# Patient Record
Sex: Female | Born: 1960
Health system: Southern US, Community
[De-identification: ages and names within clinical notes are randomized; demographics above are authoritative.]

## PROBLEM LIST (undated history)

## (undated) DIAGNOSIS — D573 Sickle-cell trait: Secondary | ICD-10-CM

## (undated) DIAGNOSIS — E079 Disorder of thyroid, unspecified: Secondary | ICD-10-CM

## (undated) DIAGNOSIS — E789 Disorder of lipoprotein metabolism, unspecified: Secondary | ICD-10-CM

## (undated) DIAGNOSIS — I10 Essential (primary) hypertension: Secondary | ICD-10-CM

## (undated) DIAGNOSIS — E7800 Pure hypercholesterolemia, unspecified: Secondary | ICD-10-CM

## (undated) HISTORY — PX: THYROIDECTOMY: SHX17

---

## 2002-01-28 ENCOUNTER — Encounter: Payer: Self-pay | Admitting: Family Medicine

## 2002-01-28 ENCOUNTER — Ambulatory Visit (HOSPITAL_COMMUNITY): Admission: RE | Admit: 2002-01-28 | Discharge: 2002-01-28 | Payer: Self-pay | Admitting: Family Medicine

## 2012-10-06 ENCOUNTER — Other Ambulatory Visit (HOSPITAL_COMMUNITY): Payer: Self-pay | Admitting: General Practice

## 2012-10-06 DIAGNOSIS — Z139 Encounter for screening, unspecified: Secondary | ICD-10-CM

## 2012-10-08 ENCOUNTER — Ambulatory Visit (HOSPITAL_COMMUNITY): Payer: Self-pay

## 2012-10-25 ENCOUNTER — Telehealth: Payer: Self-pay

## 2012-10-25 NOTE — Telephone Encounter (Signed)
Pt was referred by Rush Barer, PA for screening colonoscopy. I called pt. She is not having any problems. She said she does not have any insurance and can't afford to have colonoscopy. I told her that I would send her some papers to fill out and return to [facility] at [facility] to see if she qualifies for assistant. Will send letter to PCP.

## 2015-09-27 ENCOUNTER — Encounter (HOSPITAL_COMMUNITY): Payer: Self-pay | Admitting: Emergency Medicine

## 2015-09-27 ENCOUNTER — Emergency Department (HOSPITAL_COMMUNITY)
Admission: EM | Admit: 2015-09-27 | Discharge: 2015-09-27 | Disposition: A | Discharge status: Home / Self Care | Payer: Self-pay | Attending: Emergency Medicine | Admitting: Emergency Medicine

## 2015-09-27 DIAGNOSIS — I10 Essential (primary) hypertension: Secondary | ICD-10-CM | POA: Insufficient documentation

## 2015-09-27 DIAGNOSIS — E876 Hypokalemia: Secondary | ICD-10-CM | POA: Insufficient documentation

## 2015-09-27 HISTORY — DX: Pure hypercholesterolemia, unspecified: E78.00

## 2015-09-27 HISTORY — DX: Disorder of lipoprotein metabolism, unspecified: E78.9

## 2015-09-27 HISTORY — DX: Essential (primary) hypertension: I10

## 2015-09-27 LAB — BASIC METABOLIC PANEL
Anion gap: 11 (ref 5–15)
BUN: 15 mg/dL (ref 6–20)
CO2: 24 mmol/L (ref 22–32)
Calcium: 11.3 mg/dL — ABNORMAL HIGH (ref 8.9–10.3)
Chloride: 103 mmol/L (ref 101–111)
Creatinine, Ser: 0.55 mg/dL (ref 0.44–1.00)
GFR calc Af Amer: >60 mL/min (ref >60)
Glucose, Bld: 107 mg/dL — ABNORMAL HIGH (ref 65–99)
Potassium: 3.3 mmol/L — ABNORMAL LOW (ref 3.5–5.1)
SODIUM: 138 mmol/L (ref 135–145)

## 2015-09-27 LAB — CBG MONITORING, ED: GLUCOSE-CAPILLARY: 87 mg/dL (ref 65–99)

## 2015-09-27 MED ORDER — POTASSIUM CHLORIDE CRYS ER 20 MEQ PO TBCR
40.0000 meq | EXTENDED_RELEASE_TABLET | Freq: Once | ORAL | Status: AC
  Administered 2015-09-27: 40 meq via ORAL
  Filled 2015-09-27: qty 2

## 2015-09-27 NOTE — ED Notes (Signed)
Per EMS: Pt came by EMS base after checking bp at [facility]. 185/104 en route.  175/94 last bp reading.  Pt denies cp, sob, h/a, dizziness.  Pt takes hctz and medicine for high cholesterol.  Pt took medication today.

## 2015-09-27 NOTE — ED Provider Notes (Signed)
CSN: [id number]     Arrival date & time 09/27/15  1646 History   First MD Initiated Contact with Patient 09/27/15 1937     Chief Complaint  Patient presents with  . Hypertension     (Consider location/radiation/quality/duration/timing/severity/associated sxs/prior Treatment) HPI Comments: Patient is a 55 year old female who presents to the emergency department by EMS after noting elevation in blood pressure.  The patient states that she went to a local drugstore to pick up a prescription, and checked her blood pressure. She noted to high readings. [place], and after having her blood pressure checked there her blood pressure was elevated at 185/104. The patient was brought to the hospital for evaluation and management of this issue. The patient denies any unusual headache, shortness of breath, chest pain. She states that time she has a little mild lightheadedness, but this is not new for her. The patient has a history of hypertension, and she takes hydrochlorothiazide for this problem. She denies missing any recent doses of medication. She's not had any recent changes in her diet. It is of note that she's had some upper respiratory symptoms, and has used some over-the-counter Delsym.  Patient is a 55 y.o. female presenting with hypertension. The history is provided by the patient.  Hypertension This is a chronic problem. Pertinent negatives include no abdominal pain, arthralgias, chest pain, coughing or neck pain.    Past Medical History  Diagnosis Date  . Hypertension   . Elevated serum cholesterol    History reviewed. No pertinent past surgical history. History reviewed. No pertinent family history. Social History  Substance Use Topics  . Smoking status: Never Smoker   . Smokeless tobacco: None  . Alcohol Use: No   OB History    No data available     Review of Systems  Constitutional: Negative for activity change.       All ROS Neg except as noted in HPI   HENT: Negative for nosebleeds.   Eyes: Negative for photophobia and discharge.  Respiratory: Negative for cough, shortness of breath and wheezing.   Cardiovascular: Negative for chest pain and palpitations.  Gastrointestinal: Negative for abdominal pain and blood in stool.  Genitourinary: Negative for dysuria, frequency and hematuria.  Musculoskeletal: Negative for back pain, arthralgias and neck pain.  Skin: Negative.   Neurological: Positive for light-headedness. Negative for dizziness, seizures and speech difficulty.  Psychiatric/Behavioral: Negative for hallucinations and confusion.      Allergies  Sulfa antibiotics  Home Medications   Prior to Admission medications   Not on File   BP 145/79 mmHg  Pulse 98  Temp(Src) 98.3 F (36.8 C) (Oral)  Resp 16  Ht  (1.651 m)  Wt 55.792 kg  BMI 20.47 kg/m2  SpO2 100% Physical Exam  Constitutional: She is oriented to person, place, and time. She appears well-developed and well-nourished.  Non-toxic appearance.  HENT:  Head: Normocephalic.  Right Ear: Tympanic membrane and external ear normal.  Left Ear: Tympanic membrane and external ear normal.  Eyes: EOM and lids are normal. Pupils are equal, round, and reactive to light. Right eye exhibits no chemosis and no hordeolum. Left eye exhibits no chemosis and no hordeolum. Right conjunctiva is injected. Right conjunctiva has no hemorrhage. Left conjunctiva is not injected. Left conjunctiva has no hemorrhage. No scleral icterus. Right eye exhibits normal extraocular motion. Left eye exhibits normal extraocular motion.  Fundoscopic exam:      The right eye shows no AV nicking,  no exudate, no hemorrhage and no papilledema.       The left eye shows no AV nicking, no exudate, no hemorrhage and no papilledema.  Neck: Normal range of motion. Neck supple. Carotid bruit is not present.  Cardiovascular: Normal rate, regular rhythm, normal heart sounds, intact distal pulses and normal  pulses.   Pulmonary/Chest: Breath sounds normal. No respiratory distress.  Abdominal: Soft. Bowel sounds are normal. There is no tenderness. There is no guarding.  Musculoskeletal: Normal range of motion.  Lymphadenopathy:       Head (right side): No submandibular adenopathy present.       Head (left side): No submandibular adenopathy present.    She has no cervical adenopathy.  Neurological: She is alert and oriented to person, place, and time. She has normal strength. She displays no tremor. No cranial nerve deficit or sensory deficit. She exhibits normal muscle tone. She displays no seizure activity. Gait normal.  Skin: Skin is warm and dry.  Psychiatric: She has a normal mood and affect. Her speech is normal.  Nursing note and vitals reviewed.   ED Course  Procedures (including critical care time) Labs Review Labs Reviewed  BASIC METABOLIC PANEL    Imaging Review No results found. I have personally reviewed and evaluated these images and lab results as part of my medical decision-making.   EKG Interpretation None      MDM  Initial blood pressure was ranging from 175/94, to 166/68.  Capillary blood glucose is 97.   The patient denies any chest pain, headache, nausea vomiting, or shortness of breath on recheck.  Electrocardiogram shows a normal sinus rhythm at 87 bpm. There is a slightly rightward axis present. There are nonspecific ST-T wave changes, but no evidence of any acute event at this time.   The basic metabolic panel shows the potassium to be slightly low at 3.3, otherwise essentially within normal limits.  The patient is ambulatory without problem. The blood pressure is now down to 145/79. There no gross neurologic deficits appreciated. There is no evidence of any in organ damage.  I have advised the patient to follow with her primary physician to see if any adjustments will be needed with her current medications. The patient is invited to return to the  emergency department immediately if any changes, problems, or concerns.    Final diagnoses:  None    **I have reviewed nursing notes, vital signs, and all appropriate lab and imaging results for this patient.Ivery Quale*    Garnell Phenix, PA-C 09/27/15 2054  Bethann BerkshireJoseph Zammit, MD 09/28/15 [phone]

## 2015-09-27 NOTE — Discharge Instructions (Signed)
Your blood pressure has improved significantly since your admission to the emergency department. Your potassium was slightly low. Please increase foods high in potassium. Please discuss your pressure elevation with your primary physician next week. Please return to the emergency department if any chest pain, shortness of breath, excessive nausea vomiting, unusual headache, or changes in your general condition. Hypertension Hypertension is another name for high blood pressure. High blood pressure forces your heart to work harder to pump blood. A blood pressure reading has two numbers, which includes a higher number over a lower number (example: 110/72). HOME CARE   Have your blood pressure rechecked by your doctor.  Only take medicine as told by your doctor. Follow the directions carefully. The medicine does not work as well if you skip doses. Skipping doses also puts you at risk for problems.  Do not smoke.  Monitor your blood pressure at home as told by your doctor. GET HELP IF:  You think you are having a reaction to the medicine you are taking.  You have repeat headaches or feel dizzy.  You have puffiness (swelling) in your ankles.  You have trouble with your vision. GET HELP RIGHT AWAY IF:   You get a very bad headache and are confused.  You feel weak, numb, or faint.  You get chest or belly (abdominal) pain.  You throw up (vomit).  You cannot breathe very well. MAKE SURE YOU:   Understand these instructions.  Will watch your condition.  Will get help right away if you are not doing well or get worse.   This information is not intended to replace advice given to you by your health care provider. Make sure you discuss any questions you have with your health care provider.   Document Released: 12/10/2007 Document Revised: 06/28/2013 Document Reviewed: 04/15/2013 Elsevier Interactive Patient Education [employer]2016 Elsevier Inc.

## 2015-09-27 NOTE — ED Notes (Signed)
cbg 97, no meds en route. Pt took otc meds for cold that she had last week.  Pt had unifocal pvc on 12 lead per ems.

## 2016-02-13 ENCOUNTER — Encounter (HOSPITAL_COMMUNITY): Payer: Self-pay | Admitting: *Deleted

## 2016-02-13 ENCOUNTER — Emergency Department (HOSPITAL_COMMUNITY)
Admission: EM | Admit: 2016-02-13 | Discharge: 2016-02-13 | Disposition: A | Discharge status: Home / Self Care | Payer: Self-pay | Attending: Emergency Medicine | Admitting: Emergency Medicine

## 2016-02-13 DIAGNOSIS — T7840XA Allergy, unspecified, initial encounter: Secondary | ICD-10-CM | POA: Insufficient documentation

## 2016-02-13 DIAGNOSIS — I10 Essential (primary) hypertension: Secondary | ICD-10-CM | POA: Insufficient documentation

## 2016-02-13 DIAGNOSIS — Z79899 Other long term (current) drug therapy: Secondary | ICD-10-CM | POA: Insufficient documentation

## 2016-02-13 MED ORDER — METHYLPREDNISOLONE SODIUM SUCC 125 MG IJ SOLR
125.0000 mg | Freq: Once | INTRAMUSCULAR | Status: AC
  Administered 2016-02-13: 125 mg via INTRAVENOUS
  Filled 2016-02-13: qty 2

## 2016-02-13 MED ORDER — DIPHENHYDRAMINE HCL 50 MG/ML IJ SOLN
25.0000 mg | Freq: Once | INTRAMUSCULAR | Status: AC
  Administered 2016-02-13: 25 mg via INTRAVENOUS
  Filled 2016-02-13: qty 1

## 2016-02-13 MED ORDER — HYDROCORTISONE 1 % EX CREA
TOPICAL_CREAM | Freq: Once | CUTANEOUS | Status: AC
  Administered 2016-02-13: 1 via TOPICAL
  Filled 2016-02-13: qty 1.5

## 2016-02-13 MED ORDER — METOPROLOL SUCCINATE ER 25 MG PO TB24: 25.0000 mg | ORAL_TABLET | Freq: Every day | ORAL | 0 refills | Status: DC

## 2016-02-13 MED ORDER — FAMOTIDINE IN NACL 20-0.9 MG/50ML-% IV SOLN
20.0000 mg | Freq: Once | INTRAVENOUS | Status: AC
  Administered 2016-02-13: 20 mg via INTRAVENOUS
  Filled 2016-02-13: qty 50

## 2016-02-13 NOTE — ED Provider Notes (Signed)
[facility] Provider Note   CSN: [id number] Arrival date & time: 02/13/16  1851  First Provider Contact:  None       History   Chief Complaint Chief Complaint  Patient presents with  . Allergic Reaction    HPI Amber House is a 55 y.o. female.  Pt is here today with a possible allergic reaction to lisinopril.  She was put on lisinopril on 8/3 and said she's been lightheaded since then.  She also noticed some blisters on the right side of her neck that started today.  The blisters burn, but don't "hurt."  Pt denies any sob or tongue or lip swelling.        Past Medical History:  Diagnosis Date  . Elevated serum cholesterol   . Hypertension     There are no active problems to display for this patient.   History reviewed. No pertinent surgical history.  OB History    No data available       Home Medications    Prior to Admission medications   Medication Sig Start Date End Date Taking? Authorizing Provider  lovastatin (MEVACOR) 20 MG tablet Take 20 mg by mouth daily at 6 PM.   Yes Historical Provider, MD  triamterene-hydrochlorothiazide (MAXZIDE-25) 37.5-25 MG tablet Take 1 tablet by mouth daily.   Yes Historical Provider, MD  metoprolol succinate (TOPROL-XL) 25 MG 24 hr tablet Take 1 tablet (25 mg total) by mouth daily. 02/13/16   Jacalyn LefevreJulie Shakena Callari, MD    Family History No family history on file.  Social History Social History  Substance Use Topics  . Smoking status: Never Smoker  . Smokeless tobacco: Never Used  . Alcohol use No     Allergies   Sulfa antibiotics   Review of Systems Review of Systems  Skin: Positive for rash.  All other systems reviewed and are negative.    Physical Exam Updated Vital Signs BP 152/80 (BP Location: Right Arm)   Pulse 107   Temp 98.2 F (36.8 C) (Oral)   Resp 19   Ht 5' 5.5" (1.664 m)   Wt 125 lb (56.7 kg)   SpO2 100%   BMI 20.48 kg/m   Physical Exam  Constitutional: She is oriented to person,  place, and time. She appears well-developed and well-nourished.  HENT:  Head: Normocephalic and atraumatic.  Right Ear: External ear normal.  Left Ear: External ear normal.  Ears:  Nose: Nose normal.  Mouth/Throat: Oropharynx is clear and moist.  Eyes: Conjunctivae and EOM are normal. Pupils are equal, round, and reactive to light.  Neck: Normal range of motion. Neck supple.    Cardiovascular: Normal rate, regular rhythm, normal heart sounds and intact distal pulses.   Pulmonary/Chest: Effort normal and breath sounds normal.  Abdominal: Soft. Bowel sounds are normal.  Musculoskeletal: Normal range of motion.  Neurological: She is alert and oriented to person, place, and time.  Skin: Skin is warm and dry.  Psychiatric: She has a normal mood and affect. Her behavior is normal. Judgment and thought content normal.  Nursing note and vitals reviewed.    ED Treatments / Results  Labs (all labs ordered are listed, but only abnormal results are displayed) Labs Reviewed - No data to display  EKG  EKG Interpretation None       Radiology No results found.  Procedures Procedures (including critical care time)  Medications Ordered in ED Medications  hydrocortisone cream 1 % (not administered)  diphenhydrAMINE (BENADRYL) injection 25 mg (25  mg Intravenous Given 02/13/16 2015)  methylPREDNISolone sodium succinate (SOLU-MEDROL) 125 mg/2 mL injection 125 mg (125 mg Intravenous Given 02/13/16 2015)  famotidine (PEPCID) IVPB 20 mg premix (0 mg Intravenous Stopped 02/13/16 2101)     Initial Impression / Assessment and Plan / ED Course  I have reviewed the triage vital signs and the nursing notes.  Pertinent labs & imaging results that were available during my care of the patient were reviewed by me and considered in my medical decision making (see chart for details).  Clinical Course   I don't think it is shingles as it is on both sides of body (right neck and left ear).  This is a  very atypical presentation for allergy; however, since it started after pt started on lisinopril, I am going to have her hold that medication.  Since she is hypertensive, I will start lopressor instead and instruct pt to f/u with her pcp.  Final Clinical Impressions(s) / ED Diagnoses   Final diagnoses:  Essential hypertension  Allergic reaction, initial encounter    New Prescriptions New Prescriptions   METOPROLOL SUCCINATE (TOPROL-XL) 25 MG 24 HR TABLET    Take 1 tablet (25 mg total) by mouth daily.     Jacalyn Lefevre, MD 02/13/16 2103

## 2016-02-13 NOTE — ED Triage Notes (Signed)
Pt states that she was placed on lisinopril 5 mg 02/07/2016, advises that she has been "light headed" since being placed on the medication, also has blisters noted to right neck area,

## 2018-03-12 DIAGNOSIS — E890 Postprocedural hypothyroidism: Secondary | ICD-10-CM | POA: Diagnosis not present

## 2018-03-12 DIAGNOSIS — I10 Essential (primary) hypertension: Secondary | ICD-10-CM | POA: Diagnosis not present

## 2018-03-12 DIAGNOSIS — G629 Polyneuropathy, unspecified: Secondary | ICD-10-CM | POA: Diagnosis not present

## 2018-04-21 DIAGNOSIS — M25532 Pain in left wrist: Secondary | ICD-10-CM | POA: Diagnosis not present

## 2018-05-18 ENCOUNTER — Ambulatory Visit
Admission: EM | Admit: 2018-05-18 | Discharge: 2018-05-18 | Disposition: A | Discharge status: Home / Self Care | Payer: 59

## 2018-05-18 ENCOUNTER — Other Ambulatory Visit: Payer: Self-pay

## 2018-05-18 DIAGNOSIS — R040 Epistaxis: Secondary | ICD-10-CM

## 2018-05-18 MED ORDER — OXYMETAZOLINE HCL 0.05 % NA SOLN: NASAL | 0 refills | Status: DC

## 2018-05-18 NOTE — ED Triage Notes (Addendum)
Pt states nose bleed 3x yesterday and has been having recurring nosebleeds since 2009. Has HTN. Sees ENT for same. No bleeding today. Has appt with PCP on Dec 4th

## 2018-05-18 NOTE — Discharge Instructions (Addendum)
Recommend following up with an ear nose and throat specialist for your recurring nosebleeds

## 2018-06-02 DIAGNOSIS — R040 Epistaxis: Secondary | ICD-10-CM | POA: Diagnosis not present

## 2018-06-02 DIAGNOSIS — J310 Chronic rhinitis: Secondary | ICD-10-CM | POA: Diagnosis not present

## 2018-06-02 DIAGNOSIS — I10 Essential (primary) hypertension: Secondary | ICD-10-CM | POA: Diagnosis not present

## 2019-09-28 ENCOUNTER — Ambulatory Visit
Admission: EM | Admit: 2019-09-28 | Discharge: 2019-09-28 | Disposition: A | Discharge status: Home / Self Care | Payer: 59 | Attending: Family Medicine | Admitting: Family Medicine

## 2019-09-28 ENCOUNTER — Encounter: Payer: Self-pay | Admitting: Emergency Medicine

## 2019-09-28 ENCOUNTER — Other Ambulatory Visit: Payer: Self-pay

## 2019-09-28 DIAGNOSIS — M545 Low back pain, unspecified: Secondary | ICD-10-CM

## 2019-09-28 HISTORY — DX: Disorder of thyroid, unspecified: E07.9

## 2019-09-28 HISTORY — DX: Sickle-cell trait: D57.3

## 2019-09-28 LAB — URINALYSIS, COMPLETE (UACMP) WITH MICROSCOPIC
Bacteria, UA: NONE SEEN
Bilirubin Urine: NEGATIVE
Glucose, UA: NEGATIVE mg/dL
Ketones, ur: NEGATIVE mg/dL
Leukocytes,Ua: NEGATIVE
Nitrite: NEGATIVE
Protein, ur: NEGATIVE mg/dL
Specific Gravity, Urine: 1.025 (ref 1.005–1.030)
Squamous Epithelial / LPF: NONE SEEN (ref 0–5)
WBC, UA: NONE SEEN WBC/hpf (ref 0–5)
pH: 5.5 (ref 5.0–8.0)

## 2019-09-28 MED ORDER — MELOXICAM 15 MG PO TABS: 15.0000 mg | ORAL_TABLET | Freq: Every day | ORAL | 0 refills | Status: AC | PRN

## 2019-09-28 NOTE — ED Provider Notes (Signed)
[facility]    CSN: [id number] Arrival date & time: 09/28/19  1029   History   Chief Complaint Chief Complaint  Patient presents with  . Back Pain   HPI  59 year old female presents with low back pain.  Patient reports ongoing low back pain over the past 10 days.  Comes and goes.  Has been worse for the past 2 days.  She reports right-sided low back pain.  No recent fall, trauma, injury.  She has taken Tylenol and use a heating pad without resolution.  Seems to be worse with certain activities/movements.  No radicular symptoms.  Patient reports that she feels like she is urinating a little more frequently.  She is concerned that she may have a UTI.  No reports of saddle anesthesia or incontinence.  No other complaints at this time.  Past Medical History:  Diagnosis Date  . Elevated serum cholesterol   . Hypertension   . Sickle cell trait ([place])   . Thyroid disease    Past Surgical History:  Procedure Laterality Date  . THYROIDECTOMY      OB History   No obstetric history on file.    Home Medications    Prior to Admission medications   Medication Sig Start Date End Date Taking? Authorizing Provider  cholecalciferol (VITAMIN D3) 25 MCG (1000 UNIT) tablet Take 1,000 Units by mouth daily.   Yes [provider]  levothyroxine (SYNTHROID) 75 MCG tablet Take by mouth. 02/12/18 09/28/19 Yes [provider]  losartan (COZAAR) 50 MG tablet Take 50 mg by mouth daily. 09/14/19  Yes [provider]  lovastatin (MEVACOR) 20 MG tablet Take 20 mg by mouth daily at 6 PM.   Yes [provider]  metoprolol tartrate (LOPRESSOR) 25 MG tablet Take 0.5 tablets by mouth 2 (two) times daily. 10/20/18 10/20/19 Yes [provider]  meloxicam (MOBIC) 15 MG tablet Take 1 tablet (15 mg total) by mouth daily as needed for pain. 09/28/19   Coral Spikes, DO  hydrochlorothiazide (HYDRODIURIL) 25 MG tablet TAKE ONE-HALF TABLET (12.5 MG TOTAL) BY MOUTH  DAILY. 08/20/17 09/28/19  [provider]  metoprolol succinate (TOPROL-XL) 25 MG 24 hr tablet Take 1 tablet (25 mg total) by mouth daily. 02/13/16 09/28/19  Isla Pence, MD  potassium chloride (K-DUR) 10 MEQ tablet  04/28/18 09/28/19  [provider]    Family History Family History  Problem Relation Age of Onset  . Pancreatic cancer Mother   . Hypertension Mother   . Hyperlipidemia Mother   . Other Father        blood disorder and brain tumor  . Dementia Father     Social History Social History   Tobacco Use  . Smoking status: Former Smoker    Quit date: 2005    Years since quitting: 16.2  . Smokeless tobacco: Never Used  Substance Use Topics  . Alcohol use: No  . Drug use: No     Allergies   Lisinopril and Sulfa antibiotics   Review of Systems Review of Systems  Genitourinary: Positive for frequency.  Musculoskeletal: Positive for back pain.   Physical Exam Triage Vital Signs ED Triage Vitals  Enc Vitals Group     BP 09/28/19 1107 (!) 150/77     Pulse Rate 09/28/19 1107 62     Resp 09/28/19 1107 18     Temp 09/28/19 1107 98 F (36.7 C)     Temp Source 09/28/19 1107 Oral     SpO2  09/28/19 1107 100 %     Weight 09/28/19 1107 130 lb (59 kg)     Height 09/28/19 1107 5\' 5"  (1.651 m)     Head Circumference --      Peak Flow --      Pain Score 09/28/19 1106 7     Pain Loc --      Pain Edu? --      Excl. in GC? --    Updated Vital Signs BP (!) 150/77 (BP Location: Left Arm)   Pulse 62   Temp 98 F (36.7 C) (Oral)   Resp 18   Ht 5\' 5"  (1.651 m)   Wt 59 kg   SpO2 100%   BMI 21.63 kg/m   Visual Acuity Right Eye Distance:   Left Eye Distance:   Bilateral Distance:    Right Eye Near:   Left Eye Near:    Bilateral Near:     Physical Exam   UC Treatments / Results  Labs (all labs ordered are listed, but only abnormal results are displayed) Labs Reviewed  URINALYSIS, COMPLETE (UACMP) WITH MICROSCOPIC - Abnormal; Notable for the  following components:      Result Value   Hgb urine dipstick TRACE (*)    All other components within normal limits    EKG   Radiology No results found.  Procedures Procedures (including critical care time)  Medications Ordered in UC Medications - No data to display  Initial Impression / Assessment and Plan / UC Course  I have reviewed the triage vital signs and the nursing notes.  Pertinent labs & imaging results that were available during my care of the patient were reviewed by me and considered in my medical decision making (see chart for details).    59 year old female presents with low back pain.  Urine was negative today.  This appears to be musculoskeletal.  Meloxicam as directed.  Work note given.  Final Clinical Impressions(s) / UC Diagnoses   Final diagnoses:  Acute right-sided low back pain, unspecified whether sciatica present     Discharge Instructions     Rest.  Ice and Heat.  Medication as directed.  Take care  Dr.    ED Prescriptions    Medication Sig Dispense Auth. Provider   meloxicam (MOBIC) 15 MG tablet Take 1 tablet (15 mg total) by mouth daily as needed for pain. 30 tablet 46, DO     PDMP not reviewed this encounter.   Adriana Simas, Tommie Sams 09/28/19 1207

## 2019-09-28 NOTE — ED Triage Notes (Signed)
Patient in today c/o right sided lower back pain that radiates down her right leg off & on x 10 days. Patient states symptoms have been worse and more constant for the last 2 days. No injury noted. Patient does think that she is has been urinating a little more often. Patient has taken OTC Tylenol a few times and has been using a heating pad.

## 2019-09-28 NOTE — Discharge Instructions (Signed)
Rest.  Ice and Heat.  Medication as directed.  Take care  Dr. Adriana Simas

## 2021-06-29 ENCOUNTER — Ambulatory Visit
Admission: EM | Admit: 2021-06-29 | Discharge: 2021-06-29 | Disposition: A | Discharge status: Home / Self Care | Payer: 59 | Attending: Physician Assistant | Admitting: Physician Assistant

## 2021-06-29 ENCOUNTER — Other Ambulatory Visit: Payer: Self-pay

## 2021-06-29 ENCOUNTER — Encounter: Payer: Self-pay | Admitting: Emergency Medicine

## 2021-06-29 DIAGNOSIS — R051 Acute cough: Secondary | ICD-10-CM

## 2021-06-29 DIAGNOSIS — B349 Viral infection, unspecified: Secondary | ICD-10-CM | POA: Diagnosis not present

## 2021-06-29 DIAGNOSIS — R490 Dysphonia: Secondary | ICD-10-CM | POA: Diagnosis not present

## 2021-06-29 DIAGNOSIS — Z20822 Contact with and (suspected) exposure to covid-19: Secondary | ICD-10-CM | POA: Insufficient documentation

## 2021-06-29 DIAGNOSIS — R110 Nausea: Secondary | ICD-10-CM | POA: Diagnosis not present

## 2021-06-29 LAB — RAPID INFLUENZA A&B ANTIGENS
Influenza A (ARMC): NEGATIVE
Influenza B (ARMC): NEGATIVE

## 2021-06-29 MED ORDER — PROMETHAZINE-DM 6.25-15 MG/5ML PO SYRP: 5.0000 mL | ORAL_SOLUTION | Freq: Four times a day (QID) | ORAL | 0 refills | Status: DC | PRN

## 2021-06-29 NOTE — ED Provider Notes (Signed)
[facility]    CSN: [id number] Arrival date & time: 06/29/21  1041      History   Chief Complaint Chief Complaint  Patient presents with   Cough    HPI Amber House is a 60 y.o. female presenting for 4-day history of cough, congestion, scratchy throat and voice hoarseness.  No associated fever, sinus pain, chest pain or breathing difficulty.  Cough is dry.  She says it feels like it needs to be productive though.  No vomiting or diarrhea.  Has been a bit nauseous.  Exposed to influenza at a presumed party.  Has been taking over-the-counter decongestants.  No other complaints.  HPI  Past Medical History:  Diagnosis Date   Elevated serum cholesterol    Hypertension    Sickle cell trait (HCC)    Thyroid disease     There are no problems to display for this patient.   Past Surgical History:  Procedure Laterality Date   THYROIDECTOMY      OB History   No obstetric history on file.      Home Medications    Prior to Admission medications   Medication Sig Start Date End Date Taking? Authorizing Provider  cholecalciferol (VITAMIN D3) 25 MCG (1000 UNIT) tablet Take 1,000 Units by mouth daily.   Yes [provider]  hydrochlorothiazide (HYDRODIURIL) 12.5 MG tablet Take 12.5 mg by mouth daily. 05/28/21  Yes [provider]  levothyroxine (SYNTHROID) 75 MCG tablet Take by mouth. 02/12/18 06/29/21 Yes [provider]  losartan (COZAAR) 50 MG tablet Take 50 mg by mouth daily. 09/14/19  Yes [provider]  lovastatin (MEVACOR) 20 MG tablet Take 20 mg by mouth daily at 6 PM.   Yes [provider]  metoprolol tartrate (LOPRESSOR) 25 MG tablet Take 0.5 tablets by mouth 2 (two) times daily. 10/20/18 06/29/21 Yes [provider]  promethazine-dextromethorphan (PROMETHAZINE-DM) 6.25-15 MG/5ML syrup Take 5 mLs by mouth 4 (four) times daily as needed. 06/29/21  Yes Shirlee Latch, PA-C  meloxicam (MOBIC) 15 MG tablet Take  1 tablet (15 mg total) by mouth daily as needed for pain. 09/28/19   Tommie Sams, DO  metoprolol succinate (TOPROL-XL) 25 MG 24 hr tablet Take 1 tablet (25 mg total) by mouth daily. 02/13/16 09/28/19  Jacalyn Lefevre, MD  potassium chloride (K-DUR) 10 MEQ tablet  04/28/18 09/28/19  [provider]    Family History Family History  Problem Relation Age of Onset   Pancreatic cancer Mother    Hypertension Mother    Hyperlipidemia Mother    Other Father        blood disorder and brain tumor   Dementia Father     Social History Social History   Tobacco Use   Smoking status: Former    Types: Cigarettes    Quit date: 2005    Years since quitting: 17.9   Smokeless tobacco: Never  Vaping Use   Vaping Use: Never used  Substance Use Topics   Alcohol use: No   Drug use: No     Allergies   Lisinopril and Sulfa antibiotics   Review of Systems Review of Systems  Constitutional:  Positive for fatigue. Negative for chills, diaphoresis and fever.  HENT:  Positive for congestion, rhinorrhea, sore throat and voice change. Negative for ear pain, sinus pressure and sinus pain.   Respiratory:  Positive for cough. Negative for shortness of breath.   Gastrointestinal:  Negative for abdominal pain, nausea and vomiting.  Musculoskeletal:  Negative for arthralgias and myalgias.  Skin:  Negative for rash.  Neurological:  Negative for weakness and headaches.  Hematological:  Negative for adenopathy.    Physical Exam Triage Vital Signs ED Triage Vitals  Enc Vitals Group     BP 06/29/21 1146 121/75     Pulse Rate 06/29/21 1146 75     Resp 06/29/21 1146 14     Temp 06/29/21 1146 98.9 F (37.2 C)     Temp Source 06/29/21 1146 Oral     SpO2 06/29/21 1146 98 %     Weight 06/29/21 1142 135 lb (61.2 kg)     Height 06/29/21 1142 5\' 5"  (1.651 m)     Head Circumference --      Peak Flow --      Pain Score 06/29/21 1142 0     Pain Loc --      Pain Edu? --      Excl. in [place]? --    No  data found.  Updated Vital Signs BP 121/75 (BP Location: Left Arm)    Pulse 75    Temp 98.9 F (37.2 C) (Oral)    Resp 14    Ht 5\' 5"  (1.651 m)    Wt 135 lb (61.2 kg)    SpO2 98%    BMI 22.47 kg/m      Physical Exam Vitals and nursing note reviewed.  Constitutional:      General: She is not in acute distress.    Appearance: Normal appearance. She is ill-appearing. She is not toxic-appearing.     Comments: +voice hoarseness  HENT:     Head: Normocephalic and atraumatic.     Nose: Congestion present.     Mouth/Throat:     Mouth: Mucous membranes are moist.     Pharynx: Oropharynx is clear. Posterior oropharyngeal erythema present.  Eyes:     General: No scleral icterus.       Right eye: No discharge.        Left eye: No discharge.     Conjunctiva/sclera: Conjunctivae normal.  Cardiovascular:     Rate and Rhythm: Normal rate and regular rhythm.     Heart sounds: Normal heart sounds.  Pulmonary:     Effort: Pulmonary effort is normal. No respiratory distress.     Breath sounds: Normal breath sounds.  Musculoskeletal:     Cervical back: Neck supple.  Skin:    General: Skin is dry.  Neurological:     General: No focal deficit present.     Mental Status: She is alert. Mental status is at baseline.     Motor: No weakness.     Gait: Gait normal.  Psychiatric:        Mood and Affect: Mood normal.        Behavior: Behavior normal.        Thought Content: Thought content normal.     UC Treatments / Results  Labs (all labs ordered are listed, but only abnormal results are displayed) Labs Reviewed  RAPID INFLUENZA A&B ANTIGENS  SARS CORONAVIRUS 2 (TAT 6-24 HRS)    EKG   Radiology No results found.  Procedures Procedures (including critical care time)  Medications Ordered in UC Medications - No data to display  Initial Impression / Assessment and Plan / UC Course  I have reviewed the triage vital signs and the nursing notes.  Pertinent labs & imaging results  that were available during my care of the patient were reviewed  by me and considered in my medical decision making (see chart for details).  60 year old female presenting for 4-day history of cough, congestion, scratchy throat and voice hoarseness.  No associated fever. Positive exposure to influenza.  Vitals all stable.  She is mildly ill-appearing but nontoxic.  Nasal congestion on exam as well as mild posterior pharyngeal erythema and voice hoarseness.  Chest clear auscultation heart regular rate and rhythm.  Influenza rapid testing negative.  Discussed result with patient.  PCR COVID test obtained.  Current CDC guidelines, isolation protocol and ED precautions reviewed.  Suspect viral illness.  Advised increasing rest and fluids.  Sent Promethazine DM to pharmacy.  If voice hoarseness not better in a couple of weeks, advised to follow-up with PCP.  Patient is non-smoker.   Final Clinical Impressions(s) / UC Diagnoses   Final diagnoses:  Viral illness  Acute cough  Voice hoarseness  Nausea without vomiting     Discharge Instructions      URI/COLD SYMPTOMS: Your exam today is consistent with a viral illness. Antibiotics are not indicated at this time. Use medications as directed, including cough syrup, nasal saline, and decongestants. Your symptoms should improve over the next few days and resolve within 7-10 days. Increase rest and fluids. F/u if symptoms worsen or predominate such as sore throat, ear pain, productive cough, shortness of breath, or if you develop high fevers or worsening fatigue over the next several days.    You have received COVID testing today either for positive exposure, concerning symptoms that could be related to COVID infection, screening purposes, or re-testing after confirmed positive.  Your test obtained today checks for active viral infection in the last 1-2 weeks. If your test is negative now, you can still test positive later. So, if you do develop  symptoms you should either get re-tested and/or isolate x 5 days and then strict mask use x 5 days (unvaccinated) or mask use x 10 days (vaccinated). Please follow CDC guidelines.  While Rapid antigen tests come back in 15-20 minutes, send out PCR/molecular test results typically come back within 1-3 days. In the mean time, if you are symptomatic, assume this could be a positive test and treat/monitor yourself as if you do have COVID.   We will call with test results if positive. Please download the MyChart app and set up a profile to access test results.   If symptomatic, go home and rest. Push fluids. Take Tylenol as needed for discomfort. Gargle warm salt water. Throat lozenges. Take Mucinex DM or Robitussin for cough. Humidifier in bedroom to ease coughing. Warm showers. Also review the COVID handout for more information.  COVID-19 INFECTION: The incubation period of COVID-19 is approximately 14 days after exposure, with most symptoms developing in roughly 4-5 days. Symptoms may range in severity from mild to critically severe. Roughly 80% of those infected will have mild symptoms. People of any age may become infected with COVID-19 and have the ability to transmit the virus. The most common symptoms include: fever, fatigue, cough, body aches, headaches, sore throat, nasal congestion, shortness of breath, nausea, vomiting, diarrhea, changes in smell and/or taste.    COURSE OF ILLNESS Some patients may begin with mild disease which can progress quickly into critical symptoms. If your symptoms are worsening please call ahead to the Emergency Department and proceed there for further treatment. Recovery time appears to be roughly 1-2 weeks for mild symptoms and 3-6 weeks for severe disease.   GO IMMEDIATELY TO ER FOR FEVER  YOU ARE UNABLE TO GET DOWN WITH TYLENOL, BREATHING PROBLEMS, CHEST PAIN, FATIGUE, LETHARGY, INABILITY TO EAT OR DRINK, ETC  QUARANTINE AND ISOLATION: To help decrease the spread of  COVID-19 please remain isolated if you have COVID infection or are highly suspected to have COVID infection. This means -stay home and isolate to one room in the home if you live with others. Do not share a bed or bathroom with others while ill, sanitize and wipe down all countertops and keep common areas clean and disinfected. Stay home for 5 days. If you have no symptoms or your symptoms are resolving after 5 days, you can leave your house. Continue to wear a mask around others for 5 additional days. If you have been in close contact (within 6 feet) of someone diagnosed with COVID 19, you are advised to quarantine in your home for 14 days as symptoms can develop anywhere from 2-14 days after exposure to the virus. If you develop symptoms, you  must isolate.  Most current guidelines for COVID after exposure -unvaccinated: isolate 5 days and strict mask use x 5 days. Test on day 5 is possible -vaccinated: wear mask x 10 days if symptoms do not develop -You do not necessarily need to be tested for COVID if you have + exposure and  develop symptoms. Just isolate at home x10 days from symptom onset During this global pandemic, CDC advises to practice social distancing, try to stay at least 75ft away from others at all times. Wear a face covering. Wash and sanitize your hands regularly and avoid going anywhere that is not necessary.  KEEP IN MIND THAT THE COVID TEST IS NOT 100% ACCURATE AND YOU SHOULD STILL DO EVERYTHING TO PREVENT POTENTIAL SPREAD OF VIRUS TO OTHERS (WEAR MASK, WEAR GLOVES, WASH HANDS AND SANITIZE REGULARLY). IF INITIAL TEST IS NEGATIVE, THIS MAY NOT MEAN YOU ARE DEFINITELY NEGATIVE. MOST ACCURATE TESTING IS DONE 5-7 DAYS AFTER EXPOSURE.   It is not advised by CDC to get re-tested after receiving a positive COVID test since you can still test positive for weeks to months after you have already cleared the virus.   *If you have not been vaccinated for COVID, I strongly suggest you consider  getting vaccinated as long as there are no contraindications.       ED Prescriptions     Medication Sig Dispense Auth. Provider   promethazine-dextromethorphan (PROMETHAZINE-DM) 6.25-15 MG/5ML syrup Take 5 mLs by mouth 4 (four) times daily as needed. 118 mL Shirlee Latch, PA-C      PDMP not reviewed this encounter.   Shirlee Latch, PA-C 06/29/21 1302

## 2021-06-29 NOTE — ED Triage Notes (Signed)
Patient c/o cough and chest congestion that started on Tuesday.  Patient denies fevers.

## 2021-06-29 NOTE — Discharge Instructions (Addendum)

## 2021-06-30 ENCOUNTER — Emergency Department
Admission: EM | Admit: 2021-06-30 | Discharge: 2021-06-30 | Disposition: A | Discharge status: Home / Self Care | Payer: 59 | Attending: Emergency Medicine | Admitting: Emergency Medicine

## 2021-06-30 ENCOUNTER — Other Ambulatory Visit: Payer: Self-pay

## 2021-06-30 ENCOUNTER — Emergency Department: Payer: 59

## 2021-06-30 ENCOUNTER — Encounter: Payer: Self-pay | Admitting: Emergency Medicine

## 2021-06-30 DIAGNOSIS — J181 Lobar pneumonia, unspecified organism: Secondary | ICD-10-CM | POA: Diagnosis not present

## 2021-06-30 DIAGNOSIS — Z87891 Personal history of nicotine dependence: Secondary | ICD-10-CM | POA: Diagnosis not present

## 2021-06-30 DIAGNOSIS — Z20822 Contact with and (suspected) exposure to covid-19: Secondary | ICD-10-CM | POA: Diagnosis not present

## 2021-06-30 DIAGNOSIS — J101 Influenza due to other identified influenza virus with other respiratory manifestations: Secondary | ICD-10-CM | POA: Diagnosis not present

## 2021-06-30 DIAGNOSIS — Z79899 Other long term (current) drug therapy: Secondary | ICD-10-CM | POA: Diagnosis not present

## 2021-06-30 DIAGNOSIS — I10 Essential (primary) hypertension: Secondary | ICD-10-CM | POA: Insufficient documentation

## 2021-06-30 DIAGNOSIS — J189 Pneumonia, unspecified organism: Secondary | ICD-10-CM

## 2021-06-30 DIAGNOSIS — R059 Cough, unspecified: Secondary | ICD-10-CM | POA: Diagnosis present

## 2021-06-30 LAB — RESP PANEL BY RT-PCR (FLU A&B, COVID) ARPGX2
Influenza A by PCR: POSITIVE — AB
Influenza B by PCR: NEGATIVE
SARS Coronavirus 2 by RT PCR: NEGATIVE

## 2021-06-30 LAB — SARS CORONAVIRUS 2 (TAT 6-24 HRS): SARS Coronavirus 2: NEGATIVE

## 2021-06-30 MED ORDER — AZITHROMYCIN 500 MG PO TABS
500.0000 mg | ORAL_TABLET | Freq: Every day | ORAL | Status: DC
  Administered 2021-06-30: 16:00:00 500 mg via ORAL
  Filled 2021-06-30: qty 1

## 2021-06-30 MED ORDER — AZITHROMYCIN 500 MG PO TABS: 500.0000 mg | ORAL_TABLET | Freq: Every day | ORAL | 0 refills | Status: AC

## 2021-06-30 NOTE — ED Provider Notes (Signed)
[facility] Emergency Department Provider Note    ____________________________________________   Event Date/Time   First MD Initiated Contact with Patient 06/30/21 1539     (approximate)  I have reviewed the triage vital signs and the nursing notes.   HISTORY  Chief Complaint Cough    HPI Amber House is a 60 y.o. female, history of hypertension, thyroid disease, and sickle cell trait, presents the emergency department for evaluation of cough.  Patient states that she is been feeling sick for about a week.  Reports sinus congestion, sore throat, fever, and chest congestion.  She says she feels like her sore throat has resolved, but her chest congestion has worsened.  Denies abdominal pain, nausea/vomiting, urinary symptoms, shortness of breath, or chest pain.   History limited by: No limitations.  Past Medical History:  Diagnosis Date   Elevated serum cholesterol    Hypertension    Sickle cell trait ([place])    Thyroid disease     There are no problems to display for this patient.   Past Surgical History:  Procedure Laterality Date   THYROIDECTOMY      Prior to Admission medications   Medication Sig Start Date End Date Taking? Authorizing Provider  azithromycin (ZITHROMAX) 500 MG tablet Take 1 tablet (500 mg total) by mouth daily for 3 days. Take 1 tablet daily for 3 days. 06/30/21 07/03/21 Yes Teodoro Spray, PA  cholecalciferol (VITAMIN D3) 25 MCG (1000 UNIT) tablet Take 1,000 Units by mouth daily.    [provider]  hydrochlorothiazide (HYDRODIURIL) 12.5 MG tablet Take 12.5 mg by mouth daily. 05/28/21   [provider]  levothyroxine (SYNTHROID) 75 MCG tablet Take by mouth. 02/12/18 06/29/21  [provider]  losartan (COZAAR) 50 MG tablet Take 50 mg by mouth daily. 09/14/19   [provider]  lovastatin (MEVACOR) 20 MG tablet Take 20 mg by mouth daily at 6 PM.    [provider]  meloxicam (MOBIC)  15 MG tablet Take 1 tablet (15 mg total) by mouth daily as needed for pain. 09/28/19   Coral Spikes, DO  metoprolol tartrate (LOPRESSOR) 25 MG tablet Take 0.5 tablets by mouth 2 (two) times daily. 10/20/18 06/29/21  [provider]  promethazine-dextromethorphan (PROMETHAZINE-DM) 6.25-15 MG/5ML syrup Take 5 mLs by mouth 4 (four) times daily as needed. 06/29/21   Laurene Footman B, PA-C  metoprolol succinate (TOPROL-XL) 25 MG 24 hr tablet Take 1 tablet (25 mg total) by mouth daily. 02/13/16 09/28/19  Isla Pence, MD  potassium chloride (K-DUR) 10 MEQ tablet  04/28/18 09/28/19  [provider]    Allergies Lisinopril and Sulfa antibiotics  Family History  Problem Relation Age of Onset   Pancreatic cancer Mother    Hypertension Mother    Hyperlipidemia Mother    Other Father        blood disorder and brain tumor   Dementia Father     Social History Social History   Tobacco Use   Smoking status: Former    Types: Cigarettes    Quit date: 2005    Years since quitting: 17.9   Smokeless tobacco: Never  Vaping Use   Vaping Use: Never used  Substance Use Topics   Alcohol use: No   Drug use: No    Review of Systems Review of Systems  Constitutional:  Negative for chills and fever.  HENT:  Positive for congestion and sore throat. Negative for ear pain.   Eyes:  Negative for  blurred vision.  Respiratory:  Positive for cough and sputum production. Negative for shortness of breath.   Cardiovascular:  Negative for chest pain and leg swelling.  Gastrointestinal:  Negative for abdominal pain and vomiting.  Genitourinary:  Negative for dysuria, flank pain and hematuria.  Musculoskeletal:  Negative for myalgias.  Skin:  Negative for rash.  Neurological:  Negative for headaches.    10-point ROS otherwise negative. ____________________________________________   PHYSICAL EXAM:  VITAL SIGNS: ED Triage Vitals  Enc Vitals Group     BP 06/30/21 1529 121/69     Pulse  Rate 06/30/21 1529 79     Resp 06/30/21 1529 20     Temp 06/30/21 1529 99.4 F (37.4 C)     Temp Source 06/30/21 1529 Oral     SpO2 06/30/21 1529 95 %     Weight 06/30/21 1528 135 lb (61.2 kg)     Height 06/30/21 1528 5' 5.5" (1.664 m)     Head Circumference --      Peak Flow --      Pain Score 06/30/21 1528 0     Pain Loc --      Pain Edu? --      Excl. in [place]? --     Physical Exam Vitals and nursing note reviewed.  Constitutional:      General: She is not in acute distress.    Appearance: Normal appearance. She is not ill-appearing.  HENT:     Head: Normocephalic and atraumatic.     Right Ear: External ear normal.     Left Ear: External ear normal.     Nose: Nose normal.     Mouth/Throat:     Mouth: Mucous membranes are moist.     Pharynx: Oropharynx is clear. No oropharyngeal exudate or posterior oropharyngeal erythema.  Eyes:     Extraocular Movements: Extraocular movements intact.     Conjunctiva/sclera: Conjunctivae normal.     Pupils: Pupils are equal, round, and reactive to light.  Cardiovascular:     Rate and Rhythm: Normal rate.  Pulmonary:     Effort: Pulmonary effort is normal.     Breath sounds: Rhonchi present.     Comments: Rhonchi appreciated in the lower left and right lobes. Abdominal:     General: Abdomen is flat. There is no distension.     Palpations: Abdomen is soft.  Musculoskeletal:        General: No deformity. Normal range of motion.     Cervical back: Normal range of motion and neck supple.  Lymphadenopathy:     Cervical: No cervical adenopathy.  Skin:    General: Skin is warm.  Neurological:     General: No focal deficit present.     Mental Status: She is alert and oriented to person, place, and time. Mental status is at baseline.     Motor: No weakness.  Psychiatric:        Mood and Affect: Mood normal.        Behavior: Behavior normal.        Thought Content: Thought content normal.        Judgment: Judgment normal.      ____________________________________________    LABS  (all labs ordered are listed, but only abnormal results are displayed)  Labs Reviewed  RESP PANEL BY RT-PCR (FLU A&B, COVID) ARPGX2 - Abnormal; Notable for the following components:      Result Value   Influenza A by PCR POSITIVE (*)    All  other components within normal limits     ____________________________________________   EKG Not applicable.   ____________________________________________    RADIOLOGY I personally viewed and evaluated these images as part of my medical decision making, as well as reviewing the written report by the radiologist.  ED Provider Interpretation: I agree with the interpretation of the radiologist.  DG Chest 2 View  Result Date: 06/30/2021 CLINICAL DATA:  cough EXAM: CHEST - 2 VIEW COMPARISON:  None. FINDINGS: The cardiomediastinal silhouette is normal in contour. No pleural effusion. No pneumothorax. LEFT lingular platelike opacity. Visualized abdomen is unremarkable. No acute osseous abnormality noted. IMPRESSION: LEFT lingular platelike opacity, favored atelectasis versus scar. Superimposed infection remains in the differential. Electronically Signed   By: Valentino Saxon M.D.   On: 06/30/2021 15:54    ____________________________________________   PROCEDURES  Procedures   Medications  azithromycin (ZITHROMAX) tablet 500 mg (500 mg Oral Given 06/30/21 1622)    Critical Care performed: No  ____________________________________________   INITIAL IMPRESSION / ASSESSMENT AND PLAN / ED COURSE  Pertinent labs & imaging results that were available during my care of the patient were reviewed by me and considered in my medical decision making (see chart for details).      Amber House is a 60 y.o. female, history of hypertension, thyroid disease, and sickle cell trait, presents the emergency department for evaluation of cough.  Patient states that she is been feeling sick  for about a week.  Reports sinus congestion, sore throat, fever, and chest congestion.  She says she feels like her sore throat has resolved, but her chest congestion has worsened.  Denies abdominal pain, nausea/vomiting, urinary symptoms, shortness of breath, or chest pain.   Per chart review, patient presented to [facility] urgent care for similar symptoms.  She was diagnosed with a viral illness.  Tested negative for both flu and COVID-19.  Discharged with Promethazine DM.   Differentials include, but not limited to: Serious: epiglottitis, peritonsillar abscess, retropharyngeal abscess, Ludwig angina, gonorrhea/chlamydia Common: viral pharyngitis, strep pharyngitis, mononucleosis, allergies, viral syndrome (influenza, COVID), reflux esophagitis   Upon entering the room, patient appears uncomfortable, but nontoxic.  Physical exam notable for rhonchi in the lower left and right lobes.  Throat was nonerythematous.  Uvula midline.  No exudates. No cervical lymphadenopathy.  Patient is afebrile.  Vital signs are within normal limits.  Respiratory panel positive for influenza A.  Chest x-ray shows left lingular platelike opacity, favored atelectasis versus scarring, however superimposed infection remains on the differential.  Given the patient's history, physical exam, and work-up thus far, I suspect that the patient is likely experiencing a community-acquired pneumonia, secondary to underlying influenza A infection.  I do not suspect any pharyngeal pathology.  Given the patient's normal vitals and healthy appearance, will treat outpatient with azithromycin.  Patient was discharged with anticipatory guidance and strict return precautions.  Advised her to return to the emergency department anytime if she begins to experience new or worsening symptoms.          ____________________________________________   FINAL CLINICAL IMPRESSION(S) / ED DIAGNOSES  Final diagnoses:  Community acquired pneumonia of  left lung, unspecified part of lung  Influenza A     NEW MEDICATIONS STARTED DURING THIS VISIT:  ED Discharge Orders          Ordered    azithromycin (ZITHROMAX) 500 MG tablet  Daily        06/30/21 1620  Note:  This document was prepared using Dragon voice recognition software and may include unintentional dictation errors.    Varney Daily, [place] 06/30/21 Eldridge Dace    [place]ton [place], MD 06/30/21 2109

## 2021-06-30 NOTE — ED Triage Notes (Signed)
Pt via POV from home. Pt c/o cough and nasal congestion. Denies fevers. States that she went to UC yesterday and was neg for COVID and rapid flu. Pt is A&OX4 and NAD.

## 2021-06-30 NOTE — ED Notes (Signed)
Pt to ED with husband, has had URI for past 1 week with dry cough. Pt states cough has gotten worse but has more energy than did a few days ago.

## 2021-06-30 NOTE — ED Notes (Signed)
Provider at bedside

## 2021-10-19 ENCOUNTER — Ambulatory Visit
Admission: EM | Admit: 2021-10-19 | Discharge: 2021-10-19 | Disposition: A | Discharge status: Home / Self Care | Payer: 59 | Attending: Internal Medicine | Admitting: Internal Medicine

## 2021-10-19 ENCOUNTER — Encounter: Payer: Self-pay | Admitting: Emergency Medicine

## 2021-10-19 ENCOUNTER — Other Ambulatory Visit: Payer: Self-pay

## 2021-10-19 DIAGNOSIS — D649 Anemia, unspecified: Secondary | ICD-10-CM | POA: Diagnosis present

## 2021-10-19 DIAGNOSIS — I10 Essential (primary) hypertension: Secondary | ICD-10-CM | POA: Diagnosis present

## 2021-10-19 DIAGNOSIS — R42 Dizziness and giddiness: Secondary | ICD-10-CM | POA: Diagnosis present

## 2021-10-19 DIAGNOSIS — R002 Palpitations: Secondary | ICD-10-CM | POA: Insufficient documentation

## 2021-10-19 DIAGNOSIS — Z8639 Personal history of other endocrine, nutritional and metabolic disease: Secondary | ICD-10-CM | POA: Insufficient documentation

## 2021-10-19 LAB — CBC
HCT: 35.7 % — ABNORMAL LOW (ref 36.0–46.0)
Hemoglobin: 11.8 g/dL — ABNORMAL LOW (ref 12.0–15.0)
MCH: 25.4 pg — ABNORMAL LOW (ref 26.0–34.0)
MCHC: 33.1 g/dL (ref 30.0–36.0)
MCV: 76.8 fL — ABNORMAL LOW (ref 80.0–100.0)
Platelets: 235 10*3/uL (ref 150–400)
RBC: 4.65 MIL/uL (ref 3.87–5.11)
RDW: 14.2 % (ref 11.5–15.5)
WBC: 7.3 10*3/uL (ref 4.0–10.5)
nRBC: 0 % (ref 0.0–0.2)

## 2021-10-19 LAB — COMPREHENSIVE METABOLIC PANEL
ALT: 14 U/L (ref 0–44)
AST: 14 U/L — ABNORMAL LOW (ref 15–41)
Albumin: 4.3 g/dL (ref 3.5–5.0)
Alkaline Phosphatase: 72 U/L (ref 38–126)
Anion gap: 8 (ref 5–15)
BUN: 16 mg/dL (ref 8–23)
CO2: 24 mmol/L (ref 22–32)
Calcium: 8.9 mg/dL (ref 8.9–10.3)
Chloride: 108 mmol/L (ref 98–111)
Creatinine, Ser: 0.6 mg/dL (ref 0.44–1.00)
GFR, Estimated: >60 mL/min (ref >60)
Glucose, Bld: 95 mg/dL (ref 70–99)
Potassium: 3.8 mmol/L (ref 3.5–5.1)
Sodium: 140 mmol/L (ref 135–145)
Total Bilirubin: 0.8 mg/dL (ref 0.3–1.2)
Total Protein: 7.5 g/dL (ref 6.5–8.1)

## 2021-10-19 LAB — TSH: TSH: 5.674 u[IU]/mL — ABNORMAL HIGH (ref 0.350–4.500)

## 2021-10-19 LAB — T4, FREE: Free T4: 0.98 ng/dL (ref 0.61–1.12)

## 2021-10-19 MED ORDER — MECLIZINE HCL 12.5 MG PO TABS: 12.5000 mg | ORAL_TABLET | Freq: Three times a day (TID) | ORAL | 0 refills | Status: AC | PRN

## 2021-10-19 MED ORDER — LEVOTHYROXINE SODIUM 50 MCG PO TABS: ORAL_TABLET | ORAL | 0 refills | Status: AC

## 2021-10-19 NOTE — Discharge Instructions (Addendum)
Your chemiestry show normal electrolites, your cell count shows mild anemia.  ?Your thyroid test wont be back until tomorrow, and someone will call you or send you a Mychart message about this. If you check on mychart and the TSH shows to be less than 0.5  call your doctor, since your medication will have to be adjusted.  ? ?Keep a diary of your blood pressure and needs to be less than 140/90, if stays more than this, take those readings to your doctor.  ?

## 2021-10-19 NOTE — ED Triage Notes (Signed)
Patient states that her BP medication and her thyroid medication has increased by her PCP about 3 weeks ago. Patient states that since her medications was increased she has been having some dizziness and heart palpitations that come and go.  Patient denies chest pain or SOB.   ?

## 2021-10-19 NOTE — ED Provider Notes (Addendum)
[facility]    CSN: [id number] Arrival date & time: 10/19/21  1052      History   Chief Complaint Chief Complaint  Patient presents with   Palpitations    HPI Amber House is a 61 y.o. female who presents with onset of palpitations and feeling dizzy off and on. 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 She denies CP or SOB. Her PCP increased her BP and thyroid medication 3 weeks ago.      Past Medical History:  Diagnosis Date   Elevated serum cholesterol    Hypertension    Sickle cell trait (HCC)    Thyroid disease     There are no problems to display for this patient.   Past Surgical History:  Procedure Laterality Date   THYROIDECTOMY      OB History   No obstetric history on file.      Home Medications    Prior to Admission medications   Medication Sig Start Date End Date Taking? Authorizing Provider  cholecalciferol (VITAMIN D3) 25 MCG (1000 UNIT) tablet Take 1,000 Units by mouth daily.   Yes [provider]  losartan (COZAAR) 100 MG tablet Take 100 mg by mouth daily. 09/20/21  Yes [provider]  lovastatin (MEVACOR) 20 MG tablet Take 20 mg by mouth daily at 6 PM.   Yes [provider]  meclizine (ANTIVERT) 12.5 MG tablet Take 1 tablet (12.5 mg total) by mouth 3 (three) times daily as needed for dizziness. 10/19/21  Yes Rodriguez-Southworth, Nettie Elm, PA-C  metoprolol tartrate (LOPRESSOR) 25 MG tablet Take 0.5 tablets by mouth 2 (two) times daily. 10/20/18 10/19/21 Yes [provider]  levothyroxine (SYNTHROID) 50 MCG tablet 50 mg 5 days a week and 100 mg two times a week 10/19/21   Rodriguez-Southworth, Nettie Elm, PA-C  meloxicam (MOBIC) 15 MG tablet Take 1 tablet (15 mg total) by mouth daily as needed for pain. 09/28/19   Tommie Sams, DO  metoprolol succinate (TOPROL-XL) 25 MG 24 hr tablet Take 1 tablet (25 mg total) by mouth daily.  02/13/16 09/28/19  Jacalyn Lefevre, MD  potassium chloride (K-DUR) 10 MEQ tablet  04/28/18 09/28/19  [provider]    Family History Family History  Problem Relation Age of Onset   Pancreatic cancer Mother    Hypertension Mother    Hyperlipidemia Mother    Other Father        blood disorder and brain tumor   Dementia Father     Social History Social History   Tobacco Use   Smoking status: Former    Types: Cigarettes    Quit date: 2005    Years since quitting: 18.2   Smokeless tobacco: Never  Vaping Use   Vaping Use: Never used  Substance Use Topics   Alcohol use: No   Drug use: No     Allergies   Lisinopril and Sulfa antibiotics   Review of Systems Review of Systems  Constitutional:  Negative for activity change, appetite change, chills, diaphoresis and fever.  HENT:  Negative for congestion, ear discharge, ear pain, postnasal drip, rhinorrhea and sore throat.   Eyes:  Negative for discharge and visual disturbance.  Respiratory:  Negative for cough, chest tightness and shortness of breath.   Cardiovascular:  Positive for palpitations. Negative for chest pain and leg swelling.  Gastrointestinal:  Negative for vomiting.  Musculoskeletal:  Negative for  myalgias.  Skin:  Negative for rash.  Neurological:  Positive for dizziness and light-headedness. Negative for syncope, speech difficulty, weakness and numbness.    Physical Exam Triage Vital Signs ED Triage Vitals  Enc Vitals Group     BP 10/19/21 1112 (!) 172/84     Pulse Rate 10/19/21 1112 70     Resp 10/19/21 1112 14     Temp 10/19/21 1112 97.7 F (36.5 C)     Temp Source 10/19/21 1112 Oral     SpO2 10/19/21 1112 100 %     Weight 10/19/21 1109 135 lb (61.2 kg)     Height 10/19/21 1109 5' 5.5" (1.664 m)     Head Circumference --      Peak Flow --      Pain Score 10/19/21 1109 0     Pain Loc --      Pain Edu? --      Excl. in GC? --    Orthostatic VS for the past 24 hrs:  BP- Lying Pulse-  Lying BP- Sitting Pulse- Sitting BP- Standing at 0 minutes Pulse- Standing at 0 minutes  10/19/21 1257 159/84 70 159/84 70 158/78 70    Updated Vital Signs BP (!) 172/84 (BP Location: Left Arm) Comment: Patient has not taken her BP medicines today  Pulse 70   Temp 97.7 F (36.5 C) (Oral)   Resp 14   Ht 5' 5.5" (1.664 m)   Wt 135 lb (61.2 kg)   SpO2 100%   BMI 22.12 kg/m   Visual Acuity Right Eye Distance:   Left Eye Distance:   Bilateral Distance:    Right Eye Near:   Left Eye Near:    Bilateral Near:     Physical Exam  Constitutional: She is oriented to person, place, and time. She appears well-developed and well-nourished. No distress.  HENT:  Head: Normocephalic and atraumatic.  Right Ear: External ear normal.  Left Ear: External ear normal.  Nose: Nose normal.  Eyes: Conjunctivae are normal. Right eye exhibits no discharge. Left eye exhibits no discharge. No scleral icterus.  Neck: Neck supple. No thyromegaly present.  No carotid bruits bilaterally  Cardiovascular: Normal rate and regular rhythm.  No murmur heard. Pulmonary/Chest: Effort normal and breath sounds normal. No respiratory distress.  Musculoskeletal: Normal range of motion. She exhibits no edema.  Lymphadenopathy: She has no cervical adenopathy.   Skin: Skin is warm and dry. Capillary refill takes less than 2 seconds. No rash noted. She is not diaphoretic.  Psychiatric: She has a normal mood and affect. Her behavior is normal. Judgment and thought content normal.     Neurological:     Mental Status: He is alert.     Cranial Nerves: No cranial nerve deficit or facial asymmetry.     Sensory: No sensory deficit.     Motor: No weakness.     Coordination: Romberg sign negative. Coordination normal.     Gait: Gait normal.     Deep Tendon Reflexes: Reflexes normal.     Comments: Normal Romberg, propioception, finger to nose, tandem gait.   Psychiatric:        Mood and Affect: Mood normal.        Speech:  Speech normal.        Behavior: Behavior normal.    UC Treatments / Results  Labs (all labs ordered are listed, but only abnormal results are displayed) Labs Reviewed  COMPREHENSIVE METABOLIC PANEL - Abnormal; Notable for the following components:  Result Value   AST 14 (*)    All other components within normal limits  CBC - Abnormal; Notable for the following components:   Hemoglobin 11.8 (*)    HCT 35.7 (*)    MCV 76.8 (*)    MCH 25.4 (*)    All other components within normal limits  TSH  T4, FREE    EKG Normal sinus rhythm Normal EKG  Radiology No results found.  Procedures Procedures (including critical care time)  Medications Ordered in UC Medications - No data to display  Initial Impression / Assessment and Plan / UC Course  I have reviewed the triage vital signs and the nursing notes.  Pertinent labs results that were available during my care of the patient were reviewed by me and considered in my medical decision making (see chart for details). I reviewed her prior thyroid labs today.   Palpitations Hx of hypothyroid HTN not well controlled Labyrinthitis  I placed her on Antivert as noted. TSH and free T4 are pending and we will inform her if abnormal.  Has mild anemia and told to take multivitamin with Fe.     Final Clinical Impressions(s) / UC Diagnoses   Final diagnoses:  Palpitations  History of hypothyroidism  Primary hypertension  Dizziness and giddiness  Anemia, unspecified type     Discharge Instructions      Your chemiestry show normal electrolites, your cell count shows mild anemia.  Your thyroid test wont be back until tomorrow, and someone will call you or send you a Mychart message about this. If you check on mychart and the TSH shows to be less than 0.5  call your doctor, since your medication will have to be adjusted.   Keep a diary of your blood pressure and needs to be less than 140/90, if stays more than this, take those  readings to your doctor.      ED Prescriptions     Medication Sig Dispense Auth. Provider   levothyroxine (SYNTHROID) 50 MCG tablet 50 mg 5 days a week and 100 mg two times a week 1 tablet Rodriguez-Southworth, Tarig Zimmers, PA-C   meclizine (ANTIVERT) 12.5 MG tablet Take 1 tablet (12.5 mg total) by mouth 3 (three) times daily as needed for dizziness. 30 tablet Rodriguez-Southworth, Nettie Elm, PA-C      PDMP not reviewed this encounter.   Garey Ham, PA-C 10/19/21 1501    Rodriguez-Southworth, [place], PA-C 10/19/21 1501

## 2022-10-12 ENCOUNTER — Emergency Department
Admission: EM | Admit: 2022-10-12 | Discharge: 2022-10-12 | Disposition: A | Discharge status: Home / Self Care | Payer: 59 | Attending: Emergency Medicine | Admitting: Emergency Medicine

## 2022-10-12 ENCOUNTER — Emergency Department: Payer: 59

## 2022-10-12 ENCOUNTER — Other Ambulatory Visit: Payer: Self-pay

## 2022-10-12 DIAGNOSIS — H539 Unspecified visual disturbance: Secondary | ICD-10-CM | POA: Insufficient documentation

## 2022-10-12 DIAGNOSIS — I10 Essential (primary) hypertension: Secondary | ICD-10-CM | POA: Insufficient documentation

## 2022-10-12 DIAGNOSIS — R42 Dizziness and giddiness: Secondary | ICD-10-CM | POA: Diagnosis present

## 2022-10-12 LAB — BASIC METABOLIC PANEL
Anion gap: 7 (ref 5–15)
BUN: 15 mg/dL (ref 8–23)
CO2: 26 mmol/L (ref 22–32)
Calcium: 8.8 mg/dL — ABNORMAL LOW (ref 8.9–10.3)
Chloride: 106 mmol/L (ref 98–111)
Creatinine, Ser: 0.62 mg/dL (ref 0.44–1.00)
GFR, Estimated: >60 mL/min (ref >60)
Glucose, Bld: 94 mg/dL (ref 70–99)
Potassium: 3.6 mmol/L (ref 3.5–5.1)
Sodium: 139 mmol/L (ref 135–145)

## 2022-10-12 LAB — CBC
HCT: 36 % (ref 36.0–46.0)
Hemoglobin: 11.7 g/dL — ABNORMAL LOW (ref 12.0–15.0)
MCH: 24.7 pg — ABNORMAL LOW (ref 26.0–34.0)
MCHC: 32.5 g/dL (ref 30.0–36.0)
MCV: 76.1 fL — ABNORMAL LOW (ref 80.0–100.0)
Platelets: 244 10*3/uL (ref 150–400)
RBC: 4.73 MIL/uL (ref 3.87–5.11)
RDW: 14 % (ref 11.5–15.5)
WBC: 7.6 10*3/uL (ref 4.0–10.5)
nRBC: 0 % (ref 0.0–0.2)

## 2022-10-12 NOTE — ED Triage Notes (Signed)
Pt presents to ED from home C/O "feeling off" X 2-3 days. Pt describes sense of dizziness and vision changes. Denies blurred vision.

## 2022-10-12 NOTE — ED Provider Notes (Signed)
[facility] Provider Note   Event Date/Time   First MD Initiated Contact with Patient 10/12/22 1703     (approximate) History  Dizziness  HPI Amber House is a 62 y.o. female with a stated past medical history of thyroidectomy, sickle cell trait, hypertension, and hypercholesterolemia who presents complaining of vision changes over the past 2-3 days.  Patient states that she has difficulty focusing on objects close to her after her she goes to work and sometimes while she is at work.  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  Patient states that the symptoms improve after some time and she denies any other exacerbating factors.  Patient denies any lightheadedness, nausea, vomiting, or weakness/numbness/paresthesias during these episodes. ROS: Patient currently denies any tinnitus, difficulty speaking, facial droop, sore throat, chest pain, shortness of breath, abdominal pain, nausea/vomiting/diarrhea, dysuria, or weakness/numbness/paresthesias in any extremity   Physical Exam  Triage Vital Signs: ED Triage Vitals  Enc Vitals Group     BP 10/12/22 1427 (!) 158/73     Pulse Rate 10/12/22 1427 76     Resp 10/12/22 1427 16     Temp 10/12/22 1427 98.2 F (36.8 C)     Temp Source 10/12/22 1427 Oral     SpO2 10/12/22 1427 100 %     Weight 10/12/22 1426 128 lb (58.1 kg)     Height 10/12/22 1426 5\' 5"  (1.651 m)     Head Circumference --      Peak Flow --      Pain Score 10/12/22 1426 0     Pain Loc --      Pain Edu? --      Excl. in GC? --    Most recent vital signs: Vitals:   10/12/22 1427  BP: (!) 158/73  Pulse: 76  Resp: 16  Temp: 98.2 F (36.8 C)  SpO2: 100%   General: Awake, oriented x4. CV:  Good peripheral perfusion.  Resp:  Normal effort.  Abd:  No distention.   Other:  Middle-aged well-developed well-nourished African-American female sitting in chair in exam room in no acute distress.  NIHSS 0.  Hinnts exam negative ED Results / Procedures / Treatments  Labs (all labs ordered are listed, but only abnormal results are displayed) Labs Reviewed  BASIC METABOLIC PANEL - Abnormal; Notable for the following components:      Result Value   Calcium 8.8 (*)    All other components within normal limits  CBC - Abnormal; Notable for the following components:   Hemoglobin 11.7 (*)    MCV 76.1 (*)    MCH 24.7 (*)    All other components within normal limits  RADIOLOGY ED MD interpretation: CT of the head without contrast interpreted by me shows no evidence of acute abnormalities including no intracerebral hemorrhage, obvious masses, or significant edema -Agree with radiology assessment Official radiology report(s): CT Head Wo Contrast  Result Date: 10/12/2022 CLINICAL DATA:  Dizziness, initial encounter EXAM: CT HEAD WITHOUT CONTRAST TECHNIQUE: Contiguous axial images were obtained from the base of the skull through the vertex without intravenous contrast. RADIATION DOSE REDUCTION: This exam was performed according to the departmental dose-optimization program which includes automated exposure control, adjustment of the mA and/or kV according to patient size and/or use of iterative  reconstruction technique. COMPARISON:  None Available. FINDINGS: Brain: No evidence of acute infarction, hemorrhage, hydrocephalus, extra-axial collection or mass lesion/mass effect. Vascular: No hyperdense vessel or unexpected calcification. Skull: Normal. Negative for fracture or focal lesion. Sinuses/Orbits: No acute finding. Other: None. IMPRESSION: No acute intracranial abnormality noted. Electronically Signed   By: Alcide Clever M.D.   On: 10/12/2022 19:30   PROCEDURES: Critical Care performed: No Procedures MEDICATIONS ORDERED IN ED: Medications - No data to display IMPRESSION  / MDM / ASSESSMENT AND PLAN / ED COURSE  I reviewed the triage vital signs and the nursing notes.                             The patient is on the cardiac monitor to evaluate for evidence of arrhythmia and/or significant heart rate changes. Patient's presentation is most consistent with acute presentation with potential threat to life or bodily function. Based on History, Exam, and Findings, presentation not consistent with syncope, seizure, stroke, meningitis, symptomatic anemia (gastrointestinal bleed), Increased ICP (cerebral tumor/mass), ICH. Additionally, I have a low suspicion for AOM, labyrinthitis, or other infectious process.  Reassessment: Prior to discharge symptoms controlled, patient well appearing. Disposition:  Discharge. Strict return precautions discussed w/ full understanding. Advise follow up with primary care provider within 24-48 hours.   FINAL CLINICAL IMPRESSION(S) / ED DIAGNOSES   Final diagnoses:  Dizziness  Changes in vision   Rx / DC Orders   ED Discharge Orders     None      Note:  This document was prepared using Dragon voice recognition software and may include unintentional dictation errors.   Merwyn Katos, MD 10/12/22 [phone]

## 2023-09-22 IMAGING — CR DG CHEST 2V
2 series · 2 of 2 positions shown · non-contrast
Comparison: None.

CLINICAL DATA: cough

EXAM:
CHEST - 2 VIEW

[chest pa]
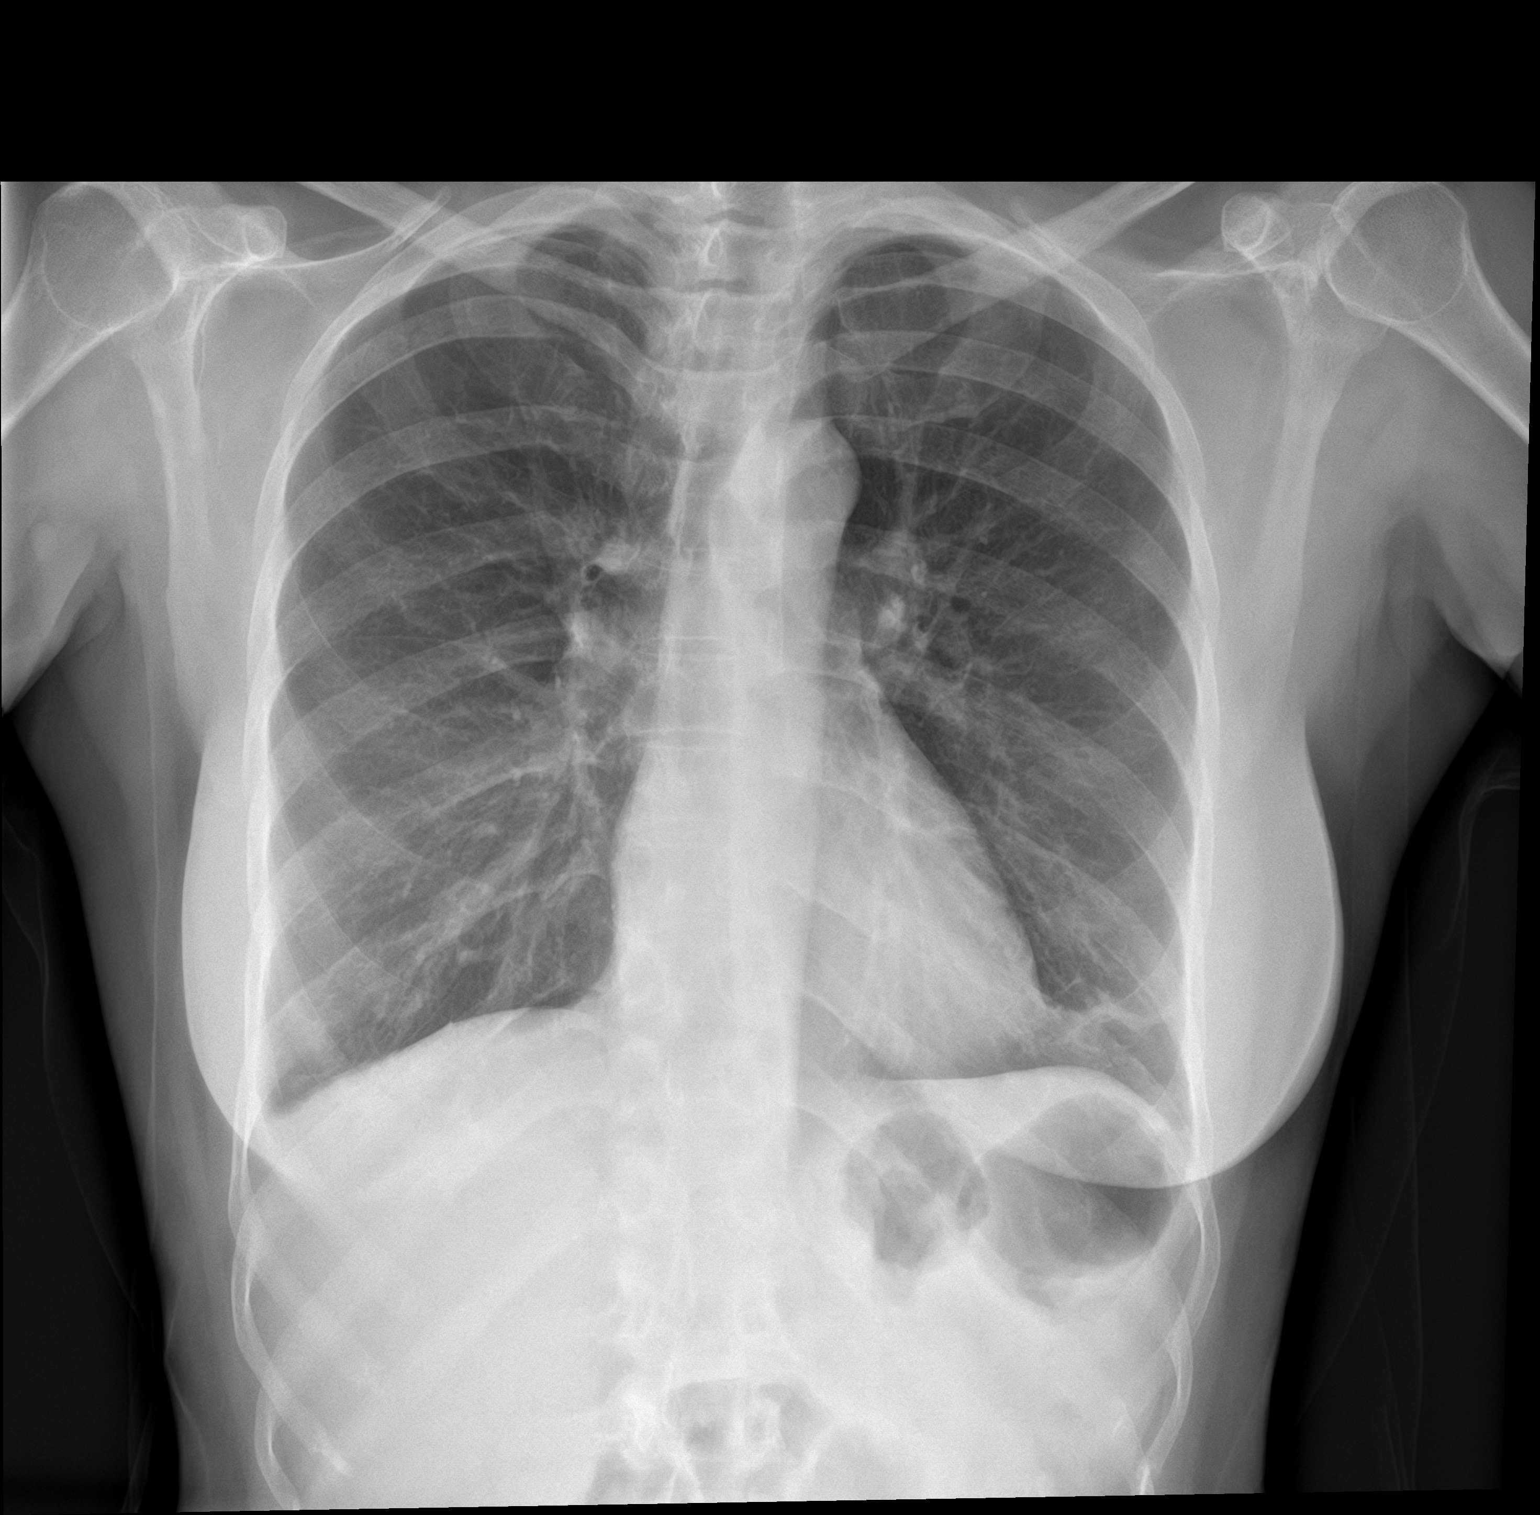

[chest lat]
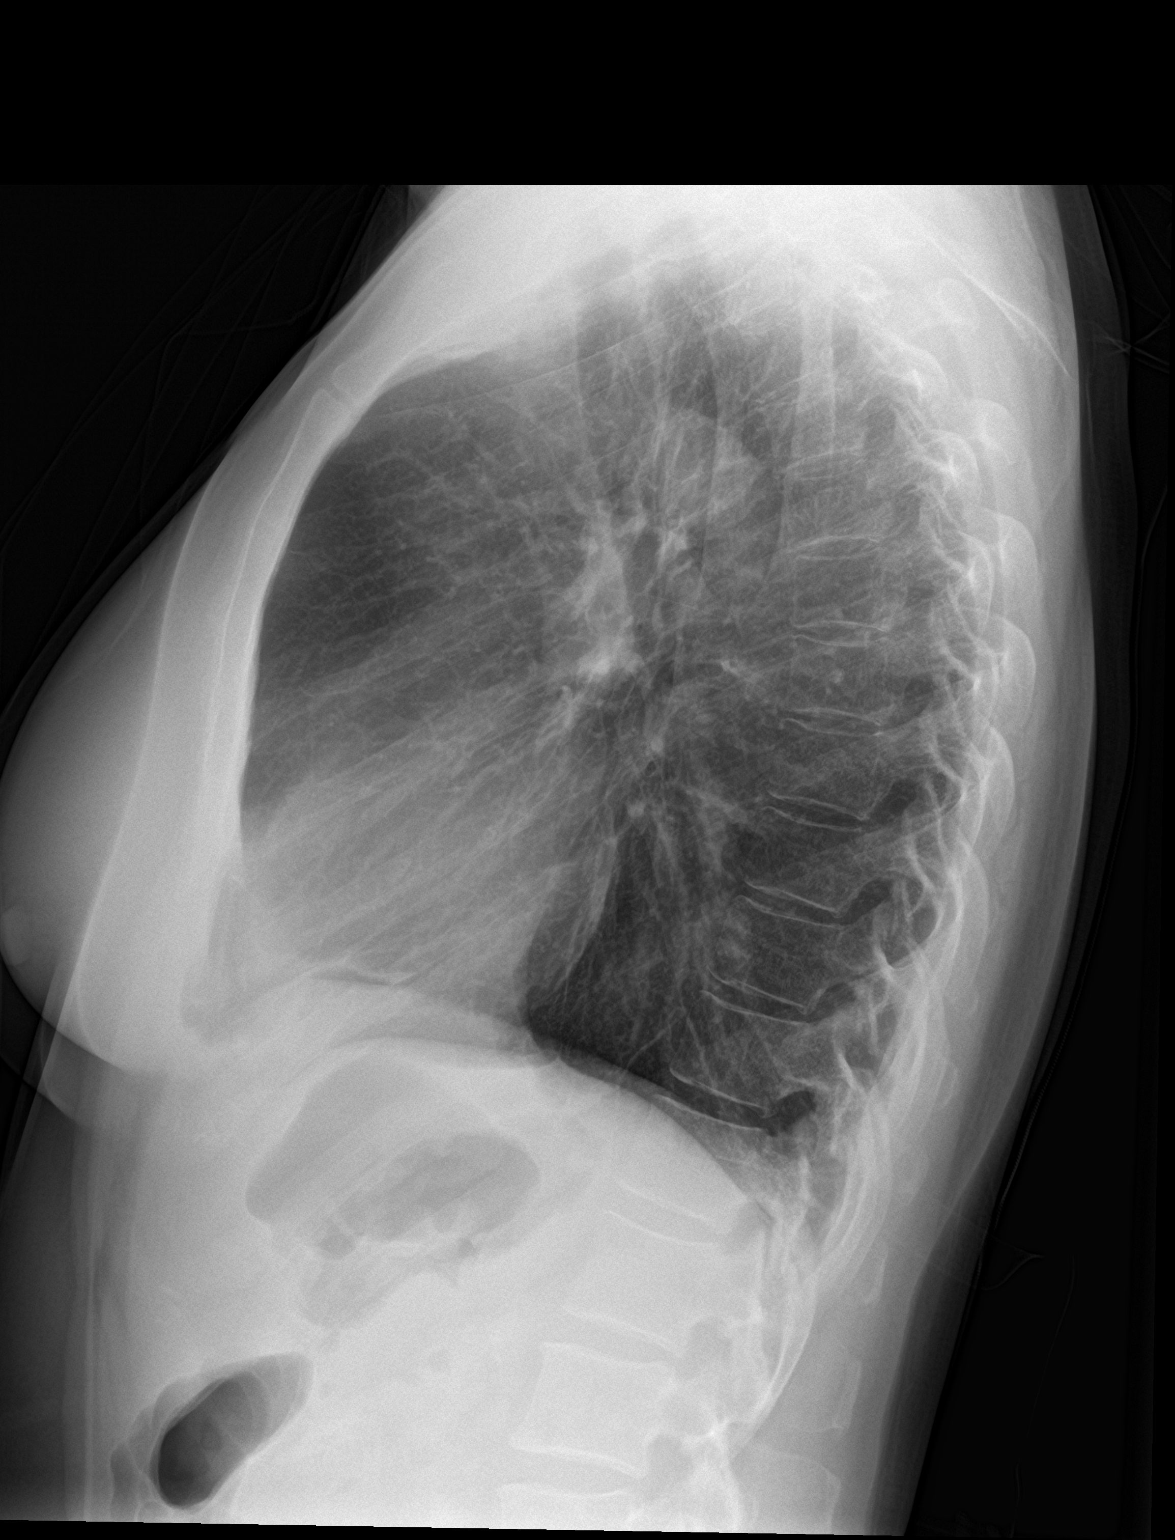

[2 of 2 positions shown; findings below may reference images not displayed]

FINDINGS: The cardiomediastinal silhouette is normal in contour. No pleural
effusion. No pneumothorax. LEFT lingular platelike opacity.
Visualized abdomen is unremarkable. No acute osseous abnormality
noted.
IMPRESSION: LEFT lingular platelike opacity, favored atelectasis versus scar.
Superimposed infection remains in the differential.
